# Patient Record
Sex: Female | Born: 1960 | Race: White | Hispanic: No | Marital: Single | Smoking: Never smoker
Health system: Southern US, Community
[De-identification: ages and names within clinical notes are randomized; demographics above are authoritative.]

## PROBLEM LIST (undated history)

## (undated) DIAGNOSIS — K760 Fatty (change of) liver, not elsewhere classified: Secondary | ICD-10-CM

## (undated) DIAGNOSIS — E039 Hypothyroidism, unspecified: Secondary | ICD-10-CM

## (undated) DIAGNOSIS — N289 Disorder of kidney and ureter, unspecified: Secondary | ICD-10-CM

## (undated) HISTORY — DX: Hypothyroidism, unspecified: E03.9

## (undated) HISTORY — DX: Disorder of kidney and ureter, unspecified: N28.9

## (undated) HISTORY — DX: Fatty (change of) liver, not elsewhere classified: K76.0

## (undated) HISTORY — DX: Morbid (severe) obesity due to excess calories: E66.01

---

## 2006-03-27 ENCOUNTER — Ambulatory Visit
Admit: 2006-03-27 | Disposition: A | Discharge status: Home / Self Care | Payer: Self-pay | Source: Ambulatory Visit | Admitting: Internal Medicine

## 2006-06-23 ENCOUNTER — Ambulatory Visit
Admit: 2006-06-23 | Disposition: A | Discharge status: Home / Self Care | Payer: Self-pay | Source: Ambulatory Visit | Admitting: Orthopaedic Surgery

## 2013-10-23 ENCOUNTER — Ambulatory Visit (INDEPENDENT_AMBULATORY_CARE_PROVIDER_SITE_OTHER): Payer: Self-pay | Admitting: Surgery

## 2014-01-01 ENCOUNTER — Ambulatory Visit (INDEPENDENT_AMBULATORY_CARE_PROVIDER_SITE_OTHER): Payer: Self-pay | Admitting: Surgery

## 2014-01-02 ENCOUNTER — Ambulatory Visit (INDEPENDENT_AMBULATORY_CARE_PROVIDER_SITE_OTHER): Payer: Commercial Managed Care - POS | Admitting: Surgery

## 2014-01-02 ENCOUNTER — Ambulatory Visit (INDEPENDENT_AMBULATORY_CARE_PROVIDER_SITE_OTHER): Payer: Commercial Managed Care - POS

## 2014-01-02 ENCOUNTER — Encounter (INDEPENDENT_AMBULATORY_CARE_PROVIDER_SITE_OTHER): Payer: Self-pay | Admitting: Surgery

## 2014-01-02 DIAGNOSIS — Z9884 Bariatric surgery status: Secondary | ICD-10-CM | POA: Insufficient documentation

## 2014-01-02 NOTE — Progress Notes (Signed)
S:  Pt presents for 6 month post sleeve surgery for diet follow up.  Pt has lost 69 pounds at this time and reports being very happy with weight loss.  Pt states consuming about 1/2 cup per meal.  Pt continues with all recommended vitamin/mineral supplements at this time.  Pt also reports she has discontinued protein from a supplemental source.  Pt reports she has tried but just doesn't care for any of the supplements.  Pt reports to help she has used tofu put in with hummus.  Pt reports no issues with hair falling out.   For physical activity, pt reports participating in:  1x/week 1 1/2 miles.  Currently, pt is able to consume about 64 oz clear fluids daily.  Pt reports no issues with N/V/C/D, although per pt report, pt does have nausea with every meal at this time. Pt reports meal pattern eating 3 meals/day, no snacks in between.  Time between meals: 5-6 hours.  Pt reports following 30/30 rule with meals.    O:  Today's Wt: 263 pounds     Previous Visit Wt:    Wt Readings from Last 10 Encounters:   01/02/14 119.296 kg (263 lb)     BMI:  45  Total Wt Loss:  69 pounds  Surgery Date:  07/22/13  Pertinent Lab Values:  See chart, values for CBC wnl for nutrition  Allergies:    Allergies   Allergen Reactions   . Tylenol (Acetaminophen) Hives       Diet Recall:   Breakfast: Easy over Egg, 1/2 6oz yogurt (greek): (14 grams pro)   Lunch: 1/2 grilled chicken (21 gms pro); 1/4 cup mixed fruit   Dinner: 1 boneless skinless chicken thigh (14 grams pro); 2 tbsp mashed potatos    Snacks: 30 pieces peanuts (7grams)   Beverages:water or unsweetened iced-tea.   Alcohol:    A:  Per diet recall, pt consumes appropriate portion sizes daily.  Meal patterns are inappropriate, pt would benefit from adding snack (high protein/produce); This may assist with decreasing nausea at meals; Pt would also benefit from increasing exercise. Food choices are acceptable and appropriate - most are high in lean protein and produce.  Pt would benefit from  measuring portions to assist with meeting needs as per report- pt keeps myfitness pal and protein is lower than target; per report today her protein intake was about 60 grams and therefore adequate.  Pt appears motivated to continue monitoring calories, exercising regularly and following recommended dietary guidelines to continue and then maintain weight loss.    P:  1.  Pt to maintain portion sizes to 3/4c per meal.  2.  Pt to continue using protein rich foods/supplements for snacks at this time.  3.  Pt to f/u in 6 months or PRN.  Pt provided contact information for any questions or concerns.

## 2014-01-02 NOTE — Progress Notes (Signed)
Subjective:       Patient ID: Alexis Coleman is a 53 y.o. female who returns for follow up 6 months post Laparoscopic Sleeve Gastrectomy. She is doing well with 70 lbs. total weight loss. She is tolerating a regular diet and denies any  vomiting, reflux, abdominal pain but has occasional nausea especially with the protein shakes. She is compliant with portion control and vitamin supplementation. Her energy levels are improving. She is exercising regularly.  Lab work is pending.  She has no further liver or kidney issues since her post-op re-admission for jaundice and worsening renal function.      HPI      Review of Systems   Constitutional: Negative for fever, chills and fatigue.   Respiratory: Negative for shortness of breath.    Gastrointestinal: Positive for nausea (mild with overeating and with protein shakes). Negative for vomiting, abdominal pain, diarrhea, constipation and abdominal distention.   Skin: Positive for rash (in the pannus requiring Nystatin powder). Negative for color change.           Objective:    Physical Exam   Nursing note and vitals reviewed.  Constitutional: She appears well-developed and well-nourished.   HENT:   Head: Atraumatic.   Eyes: EOM are normal. No scleral icterus.   Pulmonary/Chest: Breath sounds normal.   Abdominal:        Soft, NT, ND, incisions well healed, no hernias   Skin: Skin is warm and dry.           Assessment:     s/p Lap Sleeve Gastrectomy      Plan:     Overall good results thus far.  We talked about increasing protein via diet if she is unable to consume the protein shakes.  We talked about adding protein to her foods and soups, etc.  Once her labs come back I will call her.  With look carefully at her LFT's and creatine along with the vitamin  Levels.  F/u 3 months    Cc: Dr. Loistine Chance

## 2014-01-02 NOTE — Patient Instructions (Signed)
Provided pt with Meal Pattern handout.  Provided pt with hard copy of instructions reviewed.  Pt verbalized understanding and desired compliance.  No questions or concerns voiced.

## 2014-01-06 ENCOUNTER — Encounter (INDEPENDENT_AMBULATORY_CARE_PROVIDER_SITE_OTHER): Payer: Self-pay | Admitting: Surgery

## 2014-01-06 ENCOUNTER — Encounter (INDEPENDENT_AMBULATORY_CARE_PROVIDER_SITE_OTHER): Payer: Self-pay

## 2014-04-01 ENCOUNTER — Ambulatory Visit (INDEPENDENT_AMBULATORY_CARE_PROVIDER_SITE_OTHER): Payer: Commercial Managed Care - POS | Admitting: Surgery

## 2014-04-09 ENCOUNTER — Encounter (INDEPENDENT_AMBULATORY_CARE_PROVIDER_SITE_OTHER): Payer: Self-pay | Admitting: Surgery

## 2014-04-09 ENCOUNTER — Encounter (INDEPENDENT_AMBULATORY_CARE_PROVIDER_SITE_OTHER): Payer: Self-pay

## 2014-04-09 ENCOUNTER — Ambulatory Visit (INDEPENDENT_AMBULATORY_CARE_PROVIDER_SITE_OTHER): Payer: Commercial Managed Care - POS | Admitting: Surgery

## 2014-04-09 ENCOUNTER — Ambulatory Visit (INDEPENDENT_AMBULATORY_CARE_PROVIDER_SITE_OTHER): Payer: Commercial Managed Care - POS

## 2014-04-09 DIAGNOSIS — Z9884 Bariatric surgery status: Secondary | ICD-10-CM

## 2014-04-09 MED ORDER — VITAMIN D3 250 MCG (10000 UT) PO CAPS
50000.0000 [IU] | ORAL_CAPSULE | ORAL | Status: AC
Start: 2014-04-09 — End: ?

## 2014-04-09 MED ORDER — VITAMIN D3 250 MCG (10000 UT) PO CAPS
5000.0000 [IU] | ORAL_CAPSULE | Freq: Every day | ORAL | Status: AC
Start: 2014-04-09 — End: ?

## 2014-04-09 NOTE — Progress Notes (Signed)
S:  Pt presents for 6 month post SG diet follow up.  Pt has lost 87 pounds at this time and reports being very happy with weight loss.  Pt states consuming about 1/2-3/4 c per meal. Pt reports meal pattern: 3 meals/ 1 snack (a.m.).  Pt reports she is measuring all portions and using self-monitoring (myfitnesspal) to help with accountability.  Pt is also actively attending support groups ([place]).   Pt continues with all recommended vitamin/mineral supplements at this time.  Pt reports she is still struggling with protein supplements and has not been consistently getting in a supplemental protein source.  For physical activity, pt reports participating in: walking 3x/week for 3 miles.  Pt reports she has recently increased to 4 miles.   Pt reports no issues with N/V/C/D at this time.    O:  Today's Wt:  See below   Previous Visit Wt:    Wt Readings from Last 10 Encounters:   04/09/14 246 lb   01/02/14 263 lb     BMI:  There is no weight on file to calculate BMI.  Total Wt Loss:  See above-87 pounds  Surgery Date:  09/14  Pertinent Lab Values:  Received orders; will follow up @ or before 9 month visit.  Allergies:    Allergies   Allergen Reactions   . Tylenol [Acetaminophen] Other (See Comments)     Liver failure--hepatitis       Diet Recall:   Breakfast: 2 eggs (14 grams protein)   Snack: 1 yogurt (12 grams protein)   Lunch: 2oz protein   Dinner: 2 oz protein   Snacks:   Beverages: water   Alcohol:    A:  Per diet recall, pt consumes appropriate portion sizes daily.  Food choices are acceptable and appropriate - most are high in lean protein.  Per report, pt would benefit from adding produce and additional protein snack to assist with meeting needs.  Current dietary intake providing 42-54 grams/daily, adding additional yogurt will provide 66 grams which would assist with meeting recommended needs.  Pt open to additional high protein snack.  Pt appears motivated to continue monitoring calories, exercising regularly  and following recommended dietary guidelines to continue and then maintain weight loss.    P:  1.  Pt to maintain portion sizes to 3/4 c- 1 cup per meal.  2.  Pt to continue using protein rich foods/supplements for snacks adding additional yogurt as a snack/day. (meal pattern: 3 meals +2 snacks).  3.  Pt to f/u in 3 months or PRN.  Pt provided contact information for any questions or concerns.

## 2014-04-09 NOTE — Patient Instructions (Signed)
Provided pt with Support Group for [employer].and Reviewed and discussed portion sizes.   Pt verbalized understanding and desired compliance.  All questions concerns answered.

## 2014-04-09 NOTE — Progress Notes (Signed)
6 month follow up exercise session complete.  Pt said she is walking 3 times per week up to 3-4 miles.  Encouraged her to continue, however to add 15 minutes of strength training.  Also encouraged her to sprinkle strength exercises throughout her day while she is at work.  Provided pt with a comprehensive list of exercises, as well as demonstrations of each. Pt successfully demonstrated exercises as well.  Pt seemed very receptive.

## 2014-04-09 NOTE — Progress Notes (Signed)
Drs. Fermin Schwab, [place], and [place]   [address]   [place], [place] [address]   [phone]   [phone]     [address], [address]   [place], [place] [address]   [phone]   [phone]  Dear Dr. Loistine Chance,    Ms. Alexis Coleman returns for follow up 6 months post laparoscopic sleeve gastrectomy.  She is doing well with a 85 lbs. total weight loss.  She is tolerating a regular diet and denies any nausea, vomiting, reflux or abdominal pain.  She is compliant with portion control and vitamin supplementation.  Her energy level is good.  She is exercising regularly.  Lab work was reviewed.    CURRENT PROBLEM LIST:   Patient Active Problem List   Diagnosis   . Morbid obesity   . Bariatric surgery status     PAST MEDICAL HISTORY:   Past Medical History   Diagnosis Date   . Fatty liver disease, nonalcoholic    . Kidney disease      Polycystic kidney   . Morbid obesity    . Hypothyroidism      PAST SURGICAL HISTORY:   Past Surgical History   Procedure Laterality Date   . Colonoscopy     . Wisdom tooth extraction     . Gastrectomy       9/14     CURRENT OUTPATIENT MEDICATIONS:   Outpatient Prescriptions Marked as Taking for the 04/09/14 encounter (Office Visit) with Champ Mungo, MD   Medication Sig Dispense Refill   . calcium carbonate (OS-CAL) 1250 MG tablet Take 1 tablet by mouth daily.       Marland Kitchen levothyroxine (SYNTHROID, LEVOTHROID) 25 MCG tablet Take 25 mcg by mouth Once a day at 6:00am.       . Multiple Vitamins-Minerals (ANTIOXIDANT FORMULA SG) capsule Take 1 capsule by mouth daily.       Marland Kitchen nystatin (MYCOSTATIN) powder Apply topically 2 (two) times daily.       . vitamin B-12 (CYANOCOBALAMIN) 1000 MCG tablet Take 1,000 mcg by mouth daily.         ALLERGIES:   Allergies   Allergen Reactions   . Tylenol [Acetaminophen] Other (See Comments)     Liver failure--hepatitis      History   Substance Use Topics   . Smoking status: Never Smoker    . Smokeless tobacco: Not on file   . Alcohol  Use: No      Family History   Problem Relation Age of Onset   . Diabetes Mother    . Hypertension Mother    . Heart disease Father         Physical Exam:W/ female chaperone present during entire examination:  BP 120/78   Pulse 55   Temp(Src) 98 F (36.7 C)   Ht 5\' 4"    Wt 246 lb   BMI 42.21 kg/m2       General appearance: alert, appears stated age and cooperative  Head: Normocephalic, without obvious abnormality, atraumatic  Eyes: negative findings: conjunctivae and sclerae normal  Lungs: clear to auscultation bilaterally  Heart: S1, S2 normal  Abdomen: soft, non-tender; bowel sounds normal; no masses,  no organomegaly      Assessment:  Morbid obesity  Bariatric Status    Plan:  S/p Laparoscopic sleeve gastrectomy--Ms. Yeomans is progressing well from surgery.  We talked in length about portion control (measuring meals consistently), food choices, consistency of meals, protein and vitamin supplementation.  I reviewed their labs today.  Her vitamin D level is slightly low.  I prescribed her replacement vitamin D.  Her creatinine and liver function tests are all within normal limits.  Per patient, her nephrologist and gastroenterologist are satisfied with her results thus far.  We will continue to follow her kidney and liver function with routine blood work.    If you have any questions or concerns, please do not hesitate to contact me.      Thank you for allowing me to participate in their care.      Tommye Standard, MD, FACS

## 2014-04-11 ENCOUNTER — Encounter (INDEPENDENT_AMBULATORY_CARE_PROVIDER_SITE_OTHER): Payer: Self-pay | Admitting: Surgery

## 2014-07-01 ENCOUNTER — Ambulatory Visit (INDEPENDENT_AMBULATORY_CARE_PROVIDER_SITE_OTHER): Payer: Commercial Managed Care - POS

## 2014-07-08 ENCOUNTER — Ambulatory Visit (INDEPENDENT_AMBULATORY_CARE_PROVIDER_SITE_OTHER): Payer: Commercial Managed Care - POS | Admitting: Surgery

## 2014-07-08 ENCOUNTER — Ambulatory Visit (INDEPENDENT_AMBULATORY_CARE_PROVIDER_SITE_OTHER): Payer: Commercial Managed Care - POS

## 2014-07-08 ENCOUNTER — Encounter (INDEPENDENT_AMBULATORY_CARE_PROVIDER_SITE_OTHER): Payer: Self-pay | Admitting: Surgery

## 2014-07-08 ENCOUNTER — Encounter (INDEPENDENT_AMBULATORY_CARE_PROVIDER_SITE_OTHER): Payer: Self-pay

## 2014-07-08 VITALS — BP 126/75 | HR 47 | Temp 98.4°F | Ht 64.0 in | Wt 244.0 lb

## 2014-07-08 DIAGNOSIS — Z9884 Bariatric surgery status: Secondary | ICD-10-CM

## 2014-07-08 DIAGNOSIS — Z6841 Body Mass Index (BMI) 40.0 and over, adult: Secondary | ICD-10-CM

## 2014-07-08 NOTE — Patient Instructions (Signed)
Provided pt with Vitamin/mineral  handout.  Provided pt with hard copy of instructions reviewed.  Pt verbalized understanding and desired compliance. All questions concerns/answered.

## 2014-07-08 NOTE — Progress Notes (Signed)
Drs. Fermin Schwab, [place], and [place]   [address]   [place], [place] [address]   [phone]   [phone]     [address]   [place], [place] [address]   [phone]   [phone]  Dear Dr. Loistine Chance,    Alexis Coleman  returns for follow up 11 months post Sleeve Gastrectomy. She is doing well with 90 lbs. total weight loss. She is tolerating a regular diet and denies any nausea (minimal), vomiting, reflux or abdominal pain. She is compliant with portion control and vitamin supplementation. Her energy level is good. She is to begin exercising regularly now her boot is off.  Lab work was just recently performed--we are awaiting results.  No jaundice.    CURRENT PROBLEM LIST:   Patient Active Problem List   Diagnosis   . Morbid obesity   . Bariatric surgery status     PAST MEDICAL HISTORY:   Past Medical History   Diagnosis Date   . Fatty liver disease, nonalcoholic    . Kidney disease      Polycystic kidney   . Morbid obesity    . Hypothyroidism      PAST SURGICAL HISTORY:   Past Surgical History   Procedure Laterality Date   . Colonoscopy     . Wisdom tooth extraction     . Gastrectomy       9/14     CURRENT OUTPATIENT MEDICATIONS:   Outpatient Prescriptions Marked as Taking for the 07/08/14 encounter (Office Visit) with Champ Mungo, MD   Medication Sig Dispense Refill   . calcium carbonate (OS-CAL) 1250 MG tablet Take 1 tablet by mouth daily.     . Cholecalciferol (VITAMIN D3) 10000 UNITS capsule Take 5,000 Units by mouth daily. 30 capsule 3   . levothyroxine (SYNTHROID, LEVOTHROID) 25 MCG tablet Take 25 mcg by mouth Once a day at 6:00am.     . Multiple Vitamins-Minerals (ANTIOXIDANT FORMULA SG) capsule Take 1 capsule by mouth daily.     Marland Kitchen nystatin (MYCOSTATIN) powder Apply topically 2 (two) times daily.     . vitamin B-12 (CYANOCOBALAMIN) 1000 MCG tablet Take 1,000 mcg by mouth daily.       ALLERGIES:   Allergies   Allergen Reactions   . Tylenol [Acetaminophen] Other  (See Comments)     Liver failure--hepatitis      History   Substance Use Topics   . Smoking status: Never Smoker    . Smokeless tobacco: Not on file   . Alcohol Use: No      Family History   Problem Relation Age of Onset   . Diabetes Mother    . Hypertension Mother    . Heart disease Father         Physical Exam:W/ female chaperone present during entire examination:  BP 126/75 mmHg  Pulse 47  Temp(Src) 98.4 F (36.9 C)  Ht 5\' 4"   Wt 244 lb  BMI 41.86 kg/m2    General appearance: alert, appears stated age and cooperative  Head: Normocephalic, without obvious abnormality, atraumatic  Eyes: negative findings: conjunctivae and sclerae normal  Lungs: clear to auscultation bilaterally  Heart: S1, S2 normal  Abdomen: soft, non-tender; bowel sounds normal; no masses,  no organomegaly, no hernias      Assessment:  Morbid obesity  Bariatric Status    Plan:  S/p Laparoscopicsleeve gastrectomy --Alexis Coleman is progressing well from surgery.  We talked in length about portion control (  measuring meals consistently), food choices, consistency of meals, protein and vitamin supplementation.  Follow-up 6 months.  We are currently awaiting her lab results.  If there are any issues  I will contact her.    If you have any questions or concerns, please do not hesitate to contact me.      Thank you for allowing me to participate in their care.      Tommye Standard, MD, FACS

## 2014-07-08 NOTE — Progress Notes (Signed)
S:  Pt presents for 9 month post sleeve diet follow up.  Pt has lost 2 pounds at this time and reports being a little disappointed with weight loss-however- had a bone spur required boot for 4 weeks which may have contributed to slower rate of loss.  Pt states consuming about 1 cup  per meal.  Pt is measuring portions and reports meal pace 30-45 minutes.  Following 30/30 rule.   Pt continues with all recommended vitamin/mineral supplements at this time ([employer] multi; calcium 1,000mg  qd; vit d (2,000IU); no b-12; .  Pt also reports consuming a little  protein daily from a supplemental source.  For physical activity, pt reports participating in:  2 mile walks.  Currently, pt is able to consume about 64 oz clear fluids daily.  Pt reports no issues with N/V/C/D at this time, except if tries extra bite, will experience nausea.    O:  Today's Wt:  244 lb    Previous Visit Wt:    Wt Readings from Last 10 Encounters:   07/08/14 244 lb   07/08/14 244 lb   04/09/14 246 lb   01/02/14 263 lb     BMI:  Body mass index is 41.86 kg/(m^2).  Total Wt Loss:  19 pounds  Surgery Date:  07/22/14   Pertinent Lab Values:  Completed 07/07/14; results pending; 12 month lab orders provided today  Allergies:    Allergies   Allergen Reactions   . Tylenol [Acetaminophen] Other (See Comments)     Liver failure--hepatitis       Diet Recall:   Breakfast: 2 eggs/2 slices bacon (14 grams protein)   Lunch: Chicken Parmesan (21 grams pro)- squash and zucchini; 1/4 cottage cheese (5 grams pro)   Snack: [place] Yogurt w/fruit (15 grams pro; 400 mg Ca)   Dinner: Hamburger (21 grams protein)    Snacks:   Beverages: water   Alcohol:    A:  Per report, pt continues to be motivated to following through with nutrition/exercise recommendations.  Per diet recall, pt consumes appropriate portion sizes daily.  Food choices are acceptable and appropriate - most are high in lean protein and produce.  Pt meeting recommended fluid needs (64 oz/day) and meeting protein  needs without need for additional supplement (~ 71 gms pro from food).  Pt would benefit from checking Vit D and multivitamin to ensure meeting 200% RDAS- provide with list of acceptable OTC vitamins/minerals.  Reviewed need to take B-12.    Pt appears motivated to continue monitoring calories, exercising regularly and following recommended dietary guidelines to continue and then maintain weight loss.    P:  1.  Pt to maintain portion sizes to 1cup per meal.  2.  Pt to continue using protein rich foods/supplements for snacks at this time.  3.  Pt to f/u in 3 months or PRN.  Pt provided contact information for any questions or concerns.    Spent a total of 30 minutes educating pt in a individual one-on-one setting.

## 2014-08-06 ENCOUNTER — Ambulatory Visit (INDEPENDENT_AMBULATORY_CARE_PROVIDER_SITE_OTHER): Payer: Commercial Managed Care - POS

## 2014-08-06 ENCOUNTER — Encounter (INDEPENDENT_AMBULATORY_CARE_PROVIDER_SITE_OTHER): Payer: Self-pay

## 2014-08-06 DIAGNOSIS — Z6841 Body Mass Index (BMI) 40.0 and over, adult: Secondary | ICD-10-CM

## 2014-08-06 DIAGNOSIS — Z9884 Bariatric surgery status: Secondary | ICD-10-CM

## 2014-08-06 NOTE — Patient Instructions (Signed)
Provided pt with 1200 calorie meal plan handout.  Provided pt with hard copy of instructions reviewed.  Pt verbalized understanding and desired compliance.  All  questions or concerns questions.

## 2014-08-06 NOTE — Progress Notes (Signed)
8month exercise follow up complete.  Discussed a recent foot injury that was bothering pt. She expressed feeling discouraged and that it was a "set back." Created an exercise plan b and c that she can do, despite her injury and how to not allow one thing to keep her from doing anything.  Provided a calendar plan for patient.

## 2014-08-06 NOTE — Progress Notes (Signed)
S:  Pt presents for 12 month post sleeve diet follow up.  Pt reports she has not been able to be as active and exercise as she would like, however, reports she completed her first 5k Sunday (walked), however injured her foot (heel spur).  Pt has maintained weight at 244 pounds at this time and reports she is trying not to get frustrated but overall weight loss is encouraging.   Pt states consuming about 8-10 ounces total per meal.  Pt continues with all recommended vitamin/mineral supplements at this time.  Pt also reports consuming 45 grams protein daily from a supplemental source (premier protein and bene protein).  For physical activity, pt reports participating in: not routinely.  Currently, pt is able to consume about 60-70oz clear fluids daily.  Pt reports no issues with N/V/C/D at this time. Pt is taking the following multivitamin mineral supplements: [employer] multi 2 tablets qd (contains iron),  vit b-12 qd, calcium chews 2 chews (1,000 IU's day) - Vit D3 supplement (5,000IU qd), no B-50 complex at this time.  O:  Today's Wt:  244 lb    Previous Visit Wt:    Wt Readings from Last 10 Encounters:   08/06/14 244 lb   07/08/14 244 lb   07/08/14 244 lb   04/09/14 246 lb   01/02/14 263 lb     BMI:  Body mass index is 41.86 kg/(m^2).  Total Wt Loss:  88 pounds  Surgery Date:  07/22/13 (pre-op weight 332 lbs).  Pertinent Lab Values:  03/04/14 labs:  Vit D 22.1 (L)  B-12 279 (on lower end of wnl)  Allergies:    Allergies   Allergen Reactions   . Tylenol [Acetaminophen] Other (See Comments)     Liver failure--hepatitis       Diet Recall:   Breakfast: 2 eggs and 2 bacon 1/4 cup watermelon (14 grams pro)   Snack: 1/4 watermelon   Snack: Premier Protein Shake (30 grams pro)   Lunch: 1/2 multigrain sandwich -3 oz (21 grams pro)   Snack: [employer] Yogurt with strawberries (12 grams protein)   Dinner: 3/4 of Cheeseburger (3oz)   With 1 slice [employer] (not low fat). (17 grams protein)   Snacks:   Beverages: 60  ounces water   Alcohol:    A: Per report, pt is measuring her portions and taking supplements as recommended. Per diet recall, pt consumes appropriate portion sizes daily.  Food choices are acceptable and appropriate - most are high in lean protein. Pt may benefit from adding more produce.  Fluid intake adequate as evidenced by report (no issues with N/C) and per report, pt should be getting adequate Vit D to repleat stores- may need to repeat test and may benefit from taking Vit D following meal (essential fat from meal may assist with absorption). Protein intake 82 grams pro / day (52 grams from food)- which is adequate to meet needs.  Pt appears motivated to continue monitoring calories, exercising regularly and following recommended dietary guidelines to continue and then maintain weight loss.    P:  1.  Pt to maintain portion sizes to 1 cup per meal.  2.  Pt to continue using protein rich foods/supplements for snacks at this time.  3.  Pt to f/u in 3 months or PRN.  Pt provided contact information for any questions or concerns.    Spent a total of 30 minutes educating pt in a individual one-on-one setting.

## 2015-01-06 ENCOUNTER — Ambulatory Visit (INDEPENDENT_AMBULATORY_CARE_PROVIDER_SITE_OTHER): Payer: Commercial Managed Care - POS | Admitting: Surgery

## 2020-01-28 IMAGING — MR MRI ABDOMEN WITHOUT CONTRAST
8 of 11 series · 30 of 48 positions shown · non-contrast
Comparison: None.

MRI ABDOMEN WITHOUT CONTRAST, 01/28/2020 [DATE]: 
CLINICAL INDICATION:  Follow-up cryoablation LEFT renal malignancy. Cryoablation 
procedure performed July 2019.
TECHNIQUE: Multiplanar, multiecho position MR images of the abdomen were 
performed without intravenous contrast enhancement. This exam is performed as 
ordered without intravenous contrast enhancement.

[Series 201: survey navi · axial · 15.0mm · 1.76mm/px · 1 of 11 slices shown]
[im 1/11]
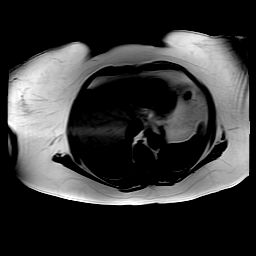

[Series 301: (id)* · coronal · 5.5mm · 0.78mm/px · 1 of 32 slices shown]
[im 1/32]
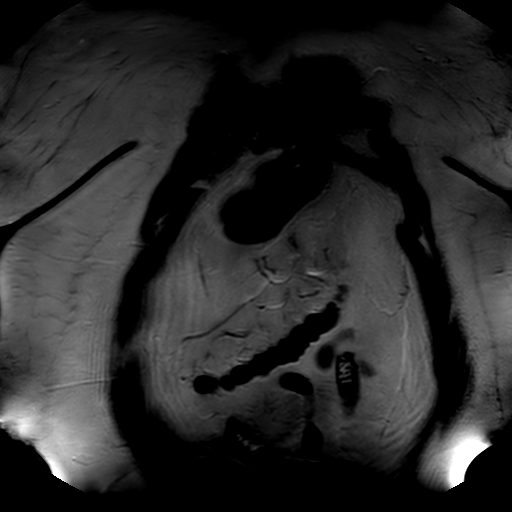

[Series 401: dwi_3b_nav · axial · 5.5mm · 1.56mm/px · z∈[-80,+184]mm · 6 of 90 slices shown]
[im 1/90]
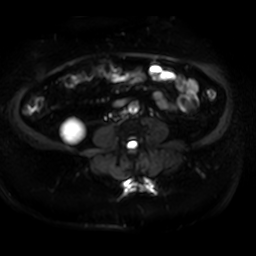
[im 18/90]
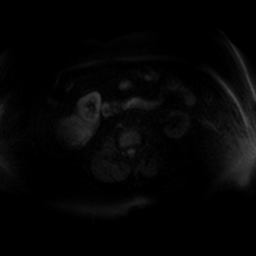
[im 36/90]
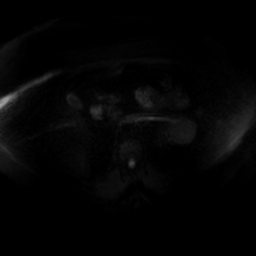
[im 54/90]
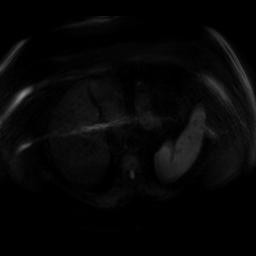
[im 72/90]
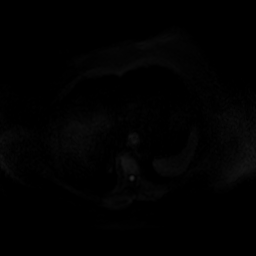
[im 90/90]
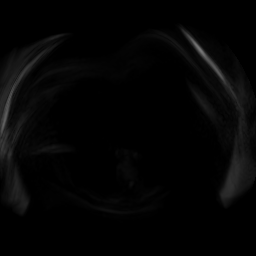

[Series 402: dadc 600 · axial · 5.5mm · 1.56mm/px · z∈[-80,+184]mm · 3 of 45 slices shown]
[im 1/45]
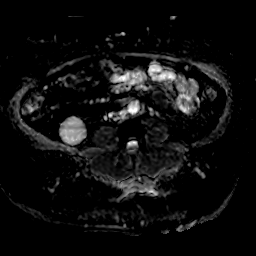
[im 23/45]
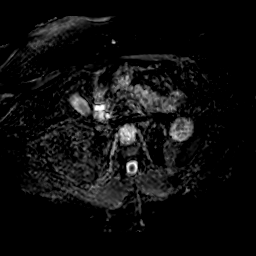
[im 45/45]
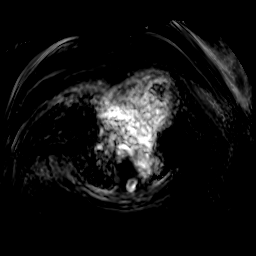

[Series 403: sb0 · axial · 5.5mm · 1.56mm/px · z∈[-80,+184]mm · 3 of 45 slices shown]
[im 1/45]
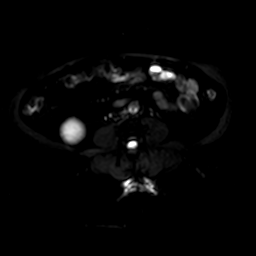
[im 23/45]
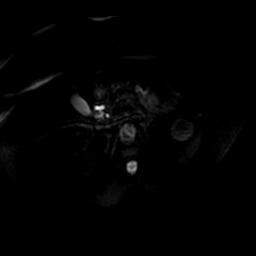
[im 45/45]
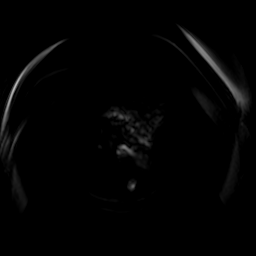

[Series 404: (id) · axial · 5.5mm · 1.56mm/px · z∈[-80,+184]mm · 3 of 45 slices shown]
[im 1/45]
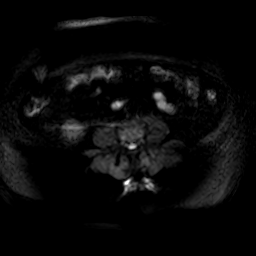
[im 23/45]
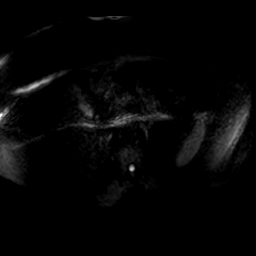
[im 45/45]
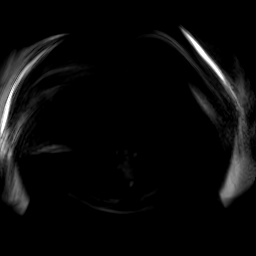

[Series 503: smdixon-in phase · axial · 3.5mm · 0.94mm/px · z∈[-78,+182]mm · 8 of 150 slices shown]
[im 1/150]
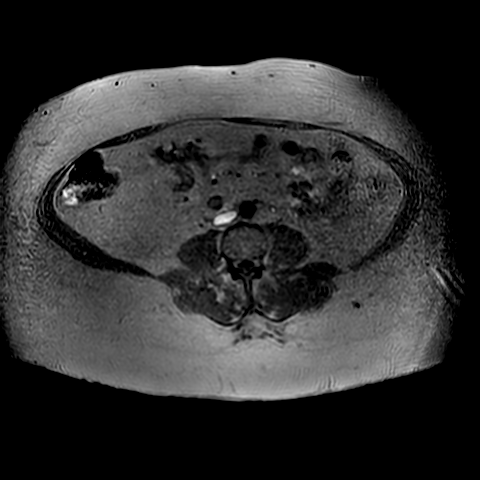
[im 30/150]
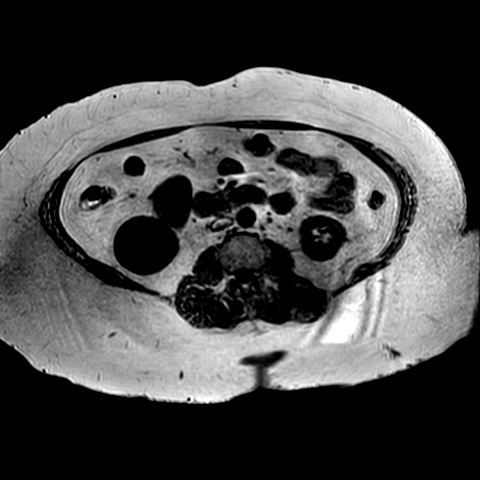
[im 45/150]
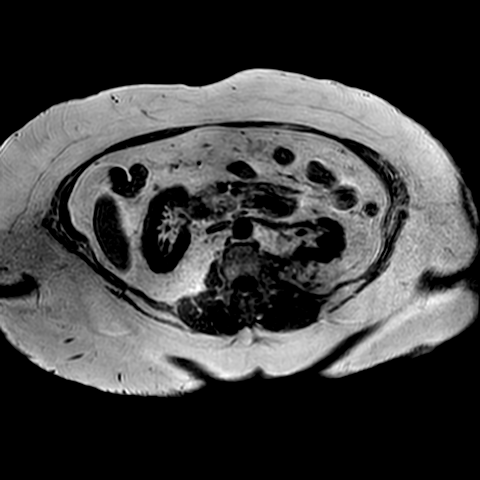
[im 60/150]
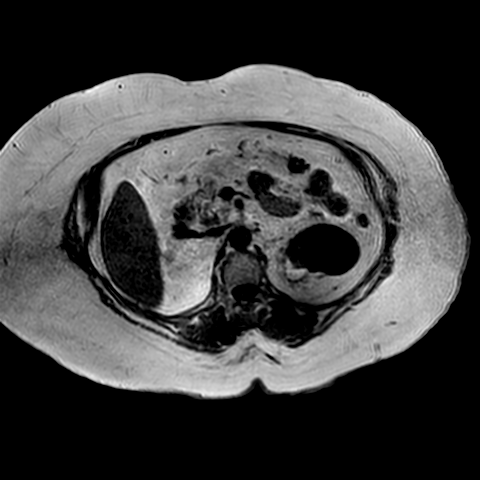
[im 90/150]
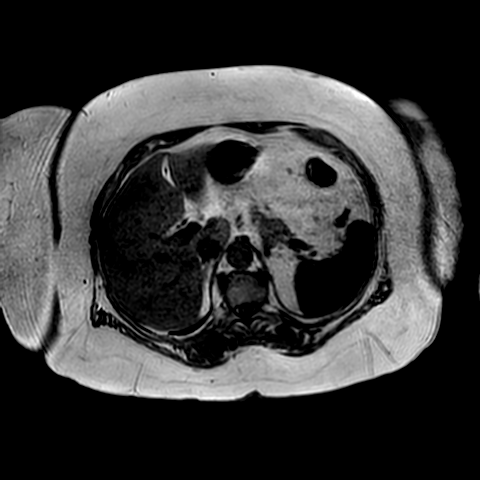
[im 105/150]
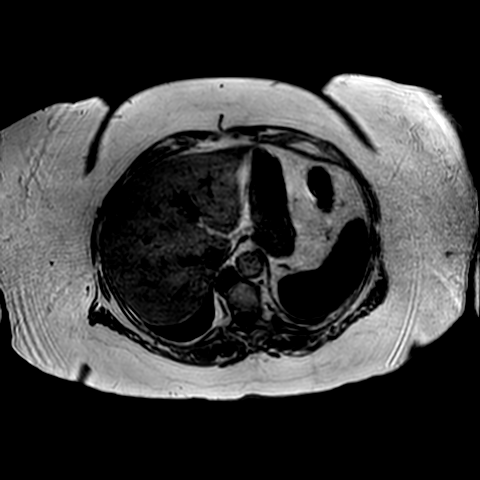
[im 120/150]
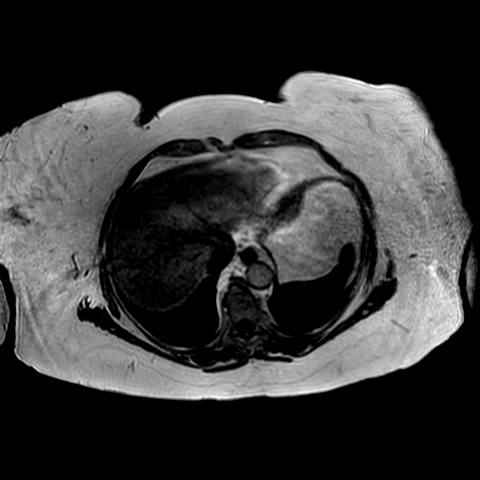
[im 150/150]
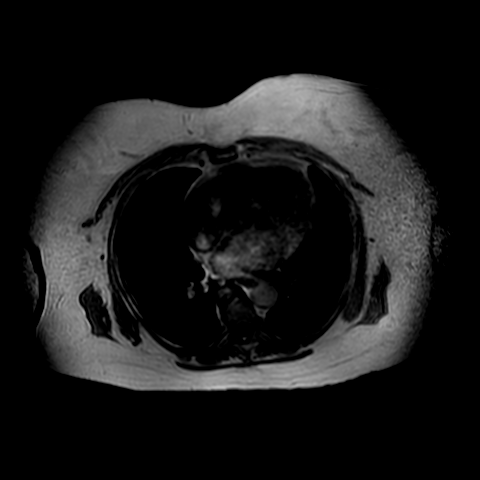

[Series 504: smdixon-out of phase · axial · 3.5mm · 0.94mm/px · z∈[-78,+77]mm · 5 of 150 slices shown]
[im 1/150]
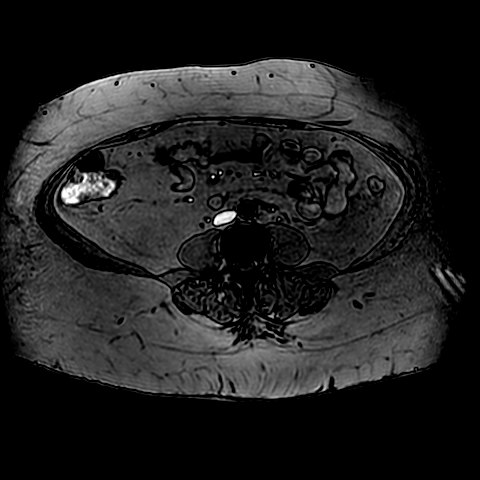
[im 30/150]
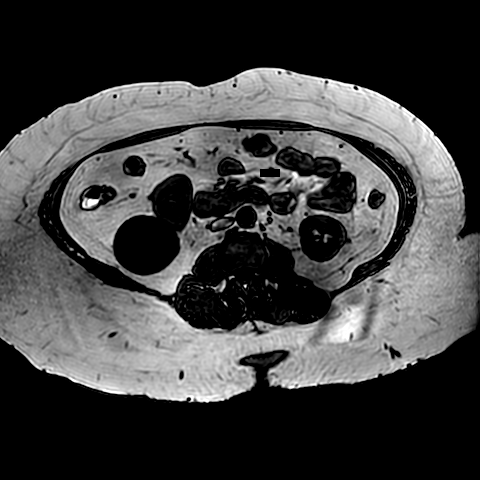
[im 45/150]
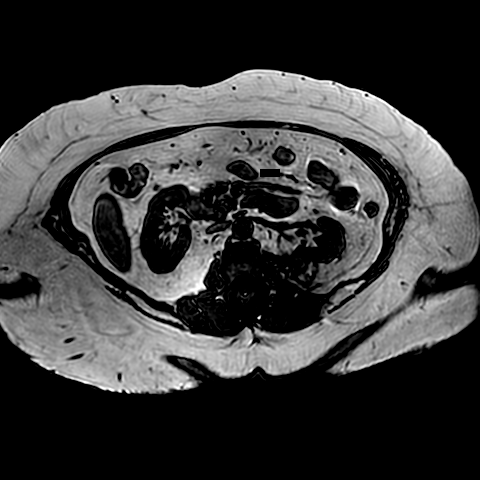
[im 60/150]
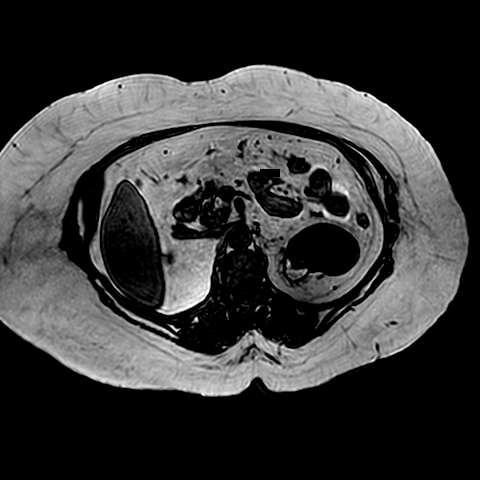
[im 90/150]
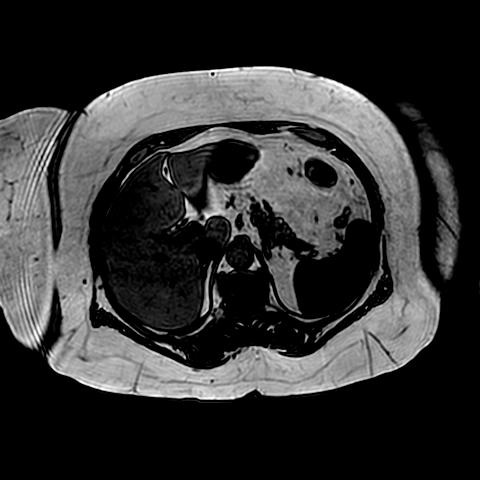

[30 of 48 positions shown; findings below may reference images not displayed]

FINDINGS: Incidental note is made of a 21 mm opacity in the medial base of the RIGHT lower 
lobe. This could represent focal atelectasis, scar, focal pneumonia or even 
neoplasm. In order to exclude neoplasm I recommend that prior exams be obtained 
first including a CT of the abdomen pelvis or chest to determine if this is a 
chronic stable phenomenon which requires no further evaluation or if it is 
potentially a new phenomenon requiring workup. If no prior exams are available 
for whatever reason, enhanced CT imaging of the chest is recommended for further 
evaluation. 
The RIGHT kidney measures approximately 10.6 cm in maximum length. Cortical 
thickness is normal. There is a 6.5 cm Bosniak one cyst projecting dorsally from 
the midpole cortex. The kidney itself is mildly rotated. The RIGHT kidney is 
otherwise unremarkable. 
Left kidney measures 9.5 cm in maximum length. A 5 cm Bosniak one cyst projects 
laterally from the upper pole cortex. An adjacent 13 mm Bosniak one cyst is also 
present. A 3.7 cm predominantly T2 hypointense mass projects exophytically along 
the posteromedial aspect of the upper pole cortex of the LEFT kidney. The 
general appearance of this lesion is consistent with typical appearance of a 
post cryoablation procedure however to exclude recurrent malignancy it would be 
necessary to administer intravenous enhancement. If there is some reason why the 
patient cannot have intravenous enhancement serial follow-up examinations will 
need to be performed to detect any change in the appearance at this location. 
Left kidney is otherwise unremarkable. Both renal veins appear normal in size. 
No retroperitoneal lymphadenopathy is found. No significant hepatic pathology is 
evident. The pancreas and spleen are within normal limits. Both adrenal glands 
appear normal. Incidentally imaged portions of the GI tract are unremarkable. 
The gallbladder and biliary tract are within normal limits. The abdominal aorta 
is normal in caliber.
IMPRESSION: 1. There is an incidental 21 mm irregularly marginated opacity in the medial 
base of the RIGHT lower lobe the lung. Differential diagnosis includes scar, 
atelectasis, pneumonia or neoplasm. The most efficient way of determining 
etiology is to obtain prior comparison CT exams of the chest or abdomen pelvis. 
If this area appears typically benign on prior CT examination and is unchanged 
in size then no further follow-up will be needed. Otherwise further evaluation 
should be performed starting with an intravenously enhanced CT of the chest. 
2. A 3.7 cm predominantly T2 hypointense mass projects posteriorly and medially 
from the upper pole of the LEFT kidney. The appearance is consistent with 
sequela of prior cryoablation however it is not possible to exclude recurrent 
neoplasm without intravenous enhancement. If there is some reason why this 
patient cannot have intravenous enhancement then I would recommend frequent 
follow-up exams to determine stability versus change of this area.

## 2022-07-14 IMAGING — CT CT ABDOMEN AND PELVIS WITHOUT CONTRAST
2 of 3 series · 16 of 46 positions shown, 18 images · non-contrast
Comparison: There are no prior exam(s) available for comparison within the past 
12 months; however, comparison was made to the prior exam(s) 01/28/2020 MRI 
abdomen.

________________________________________________________________________________________________ 
CT ABDOMEN AND PELVIS WITHOUT CONTRAST, 07/14/2022 [DATE]: 
CLINICAL INDICATION: Malignant neoplasm of left kidney, except renal pelvis. 
Status post cryoablation in July 2019. 
A search for DICOM formatted images was conducted for prior CT imaging studies 
completed at a non-affiliated media free facility.
TECHNIQUE: The abdomen and pelvis were scanned from lung bases through the 
pubic rami without contrast on a high-resolution CT scanner using dose reduction 
techniques. Routine MPR reconstructions were performed.

[Series 2: abd/pel ax wo · axial · 0.98mm/px · z∈[-506,-62]mm · 13 of 172 slices shown, 15 images]
[im 12/172  soft-tissue]
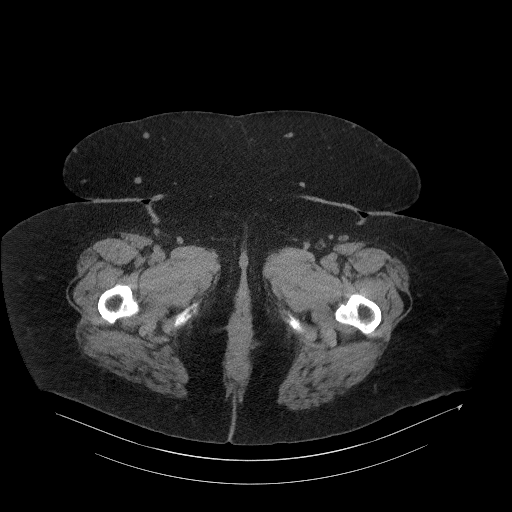
[im 12/172  bone]
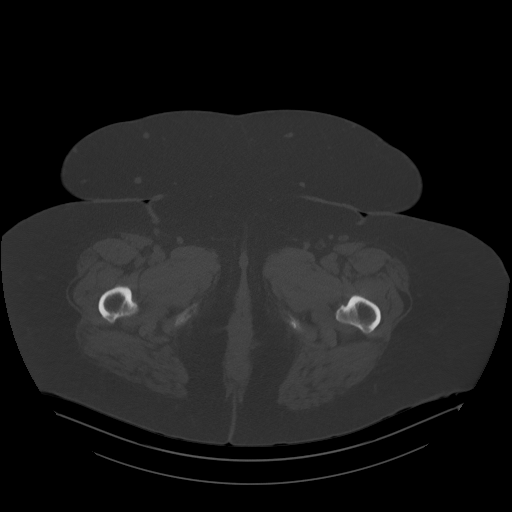
[im 23/172  soft-tissue]
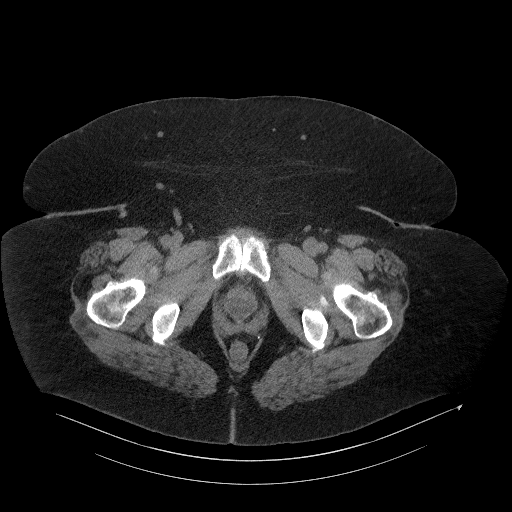
[im 34/172  soft-tissue]
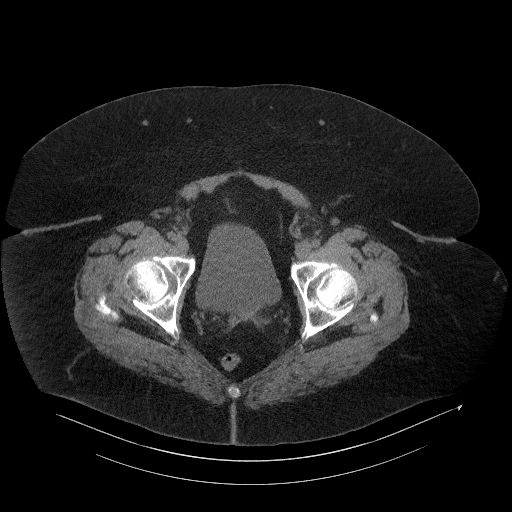
[im 50/172  soft-tissue]
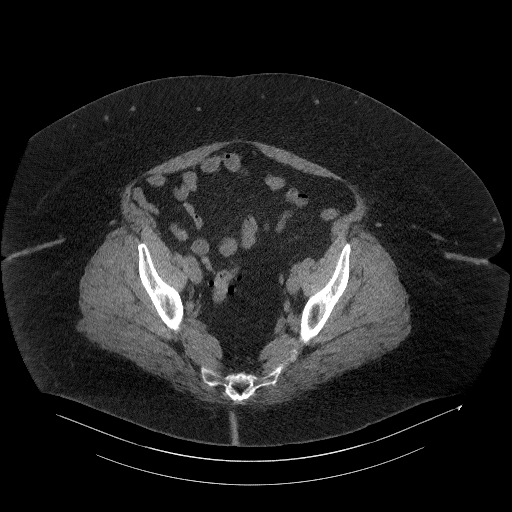
[im 61/172  soft-tissue]
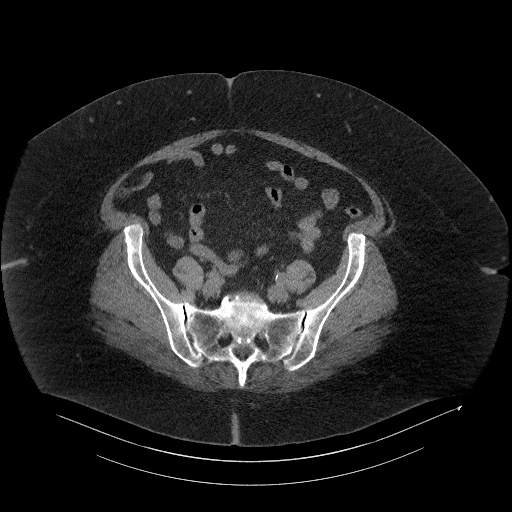
[im 72/172  soft-tissue]
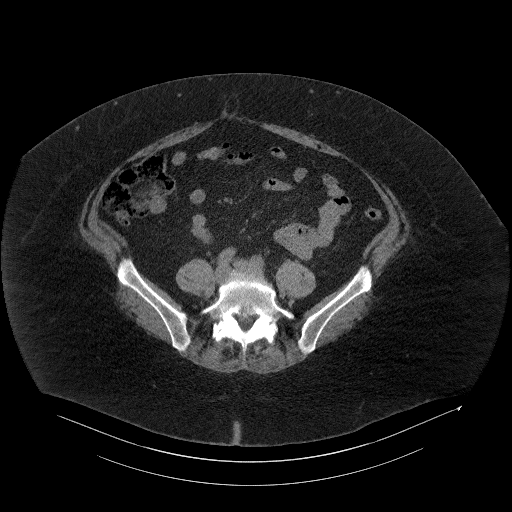
[im 89/172  soft-tissue]
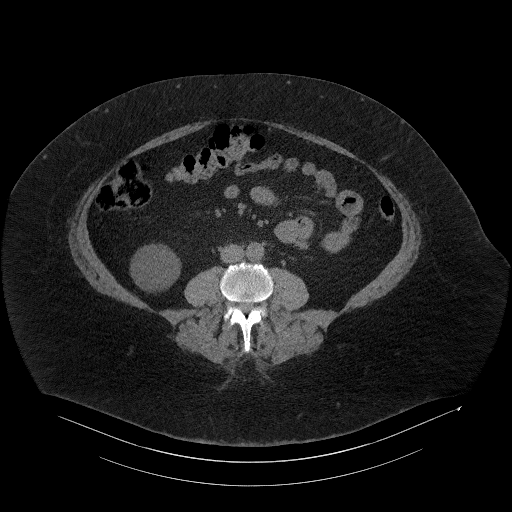
[im 100/172  soft-tissue]
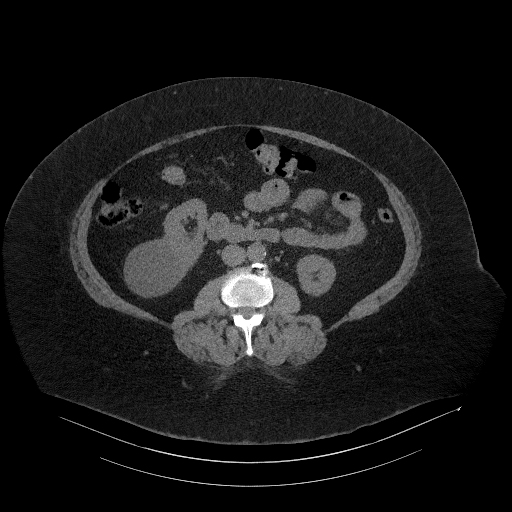
[im 111/172  soft-tissue]
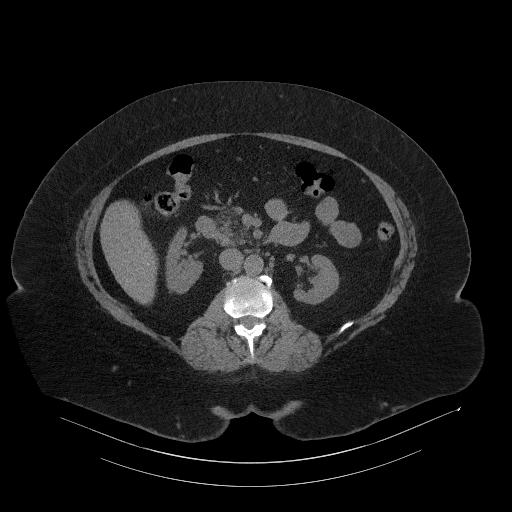
[im 111/172  bone]
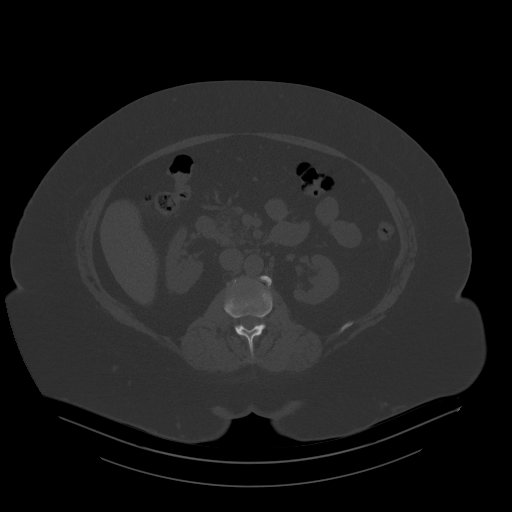
[im 122/172  soft-tissue]
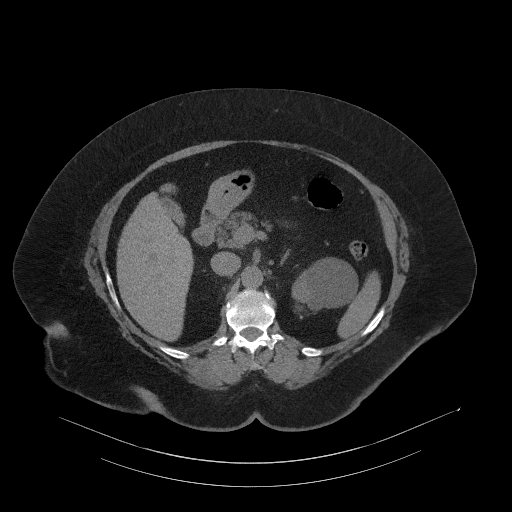
[im 138/172  soft-tissue]
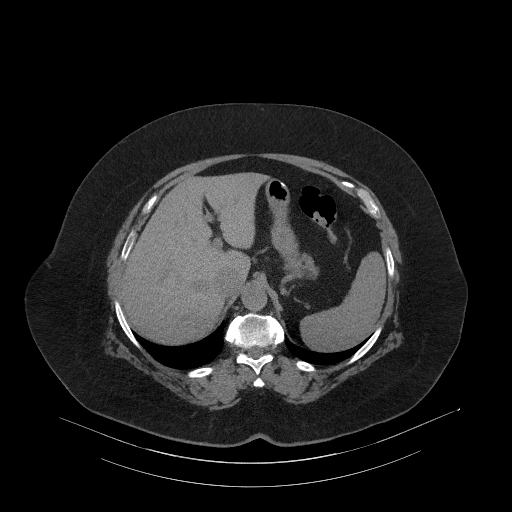
[im 149/172  soft-tissue]
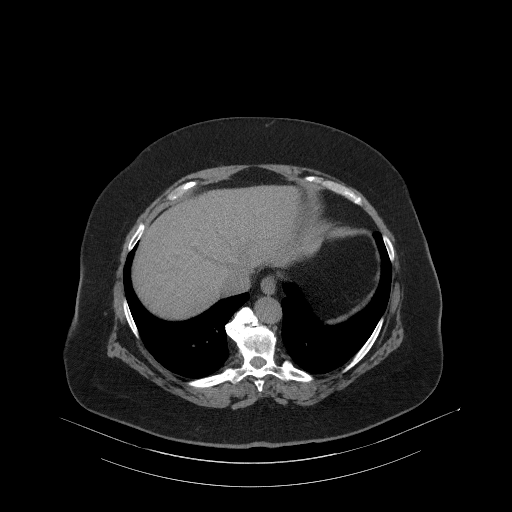
[im 160/172  soft-tissue]
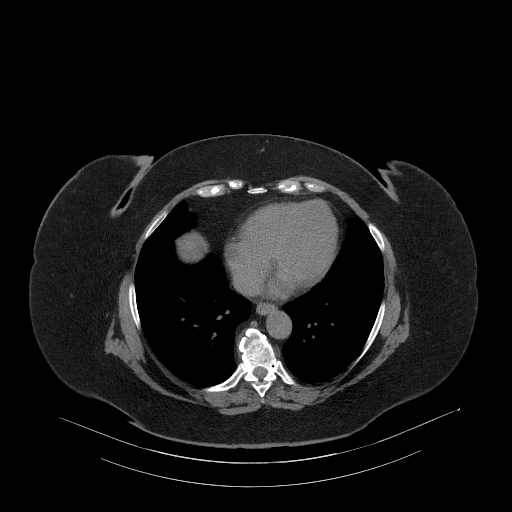

[Series 4: cor from thins · coronal · 1.08mm/px · 3 of 189 slices shown]
[im 63/189  soft-tissue]
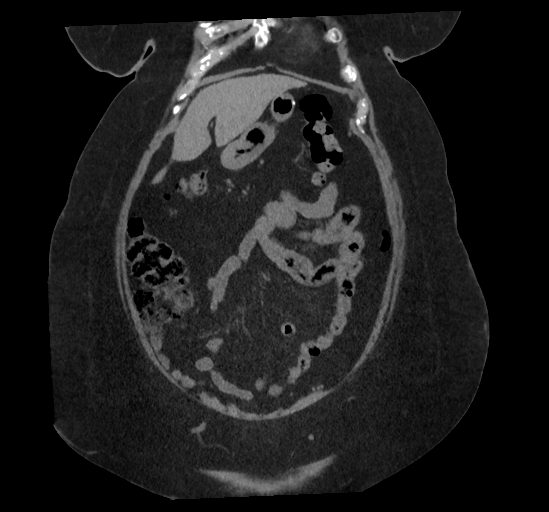
[im 84/189  soft-tissue]
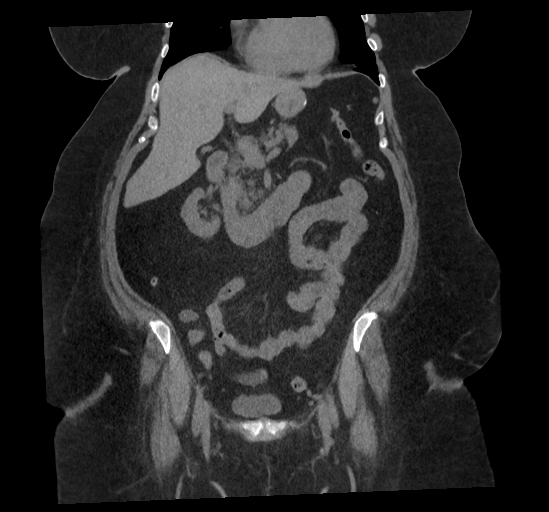
[im 105/189  soft-tissue]
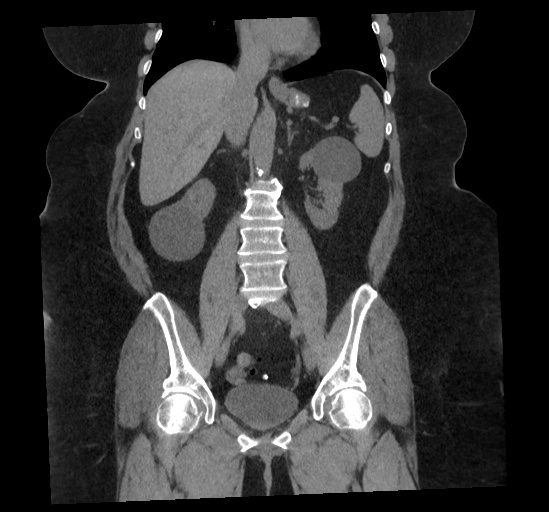

[16 of 46 positions shown; findings below may reference images not displayed]

FINDINGS: LUNG BASES: Prior granulomatous disease. No pleural effusions. 
HEPATOBILIARY: Non contrast images show no mass or biliary dilatation. No 
gallstones. 
SPLEEN: Normal in size. 
PANCREAS: No evidence for ductal dilatation or mass.   
ADRENALS: No mass. 
GENITOURINARY: Status post left posterior mid renal cryoablation with 2.1 x
cm peripherally calcified soft tissue nodule with surrounding thin curvilinear 
density measuring 3.1 x 2.7 cm in overall dimensions (previously 3.7 cm). 
Bilateral renal cysts. No hydronephrosis or kidney stones. Bladder is 
unremarkable. Hysterectomy. 
LYMPH NODES: No adenopathy. 
STOMACH, SMALL BOWEL AND COLON: Postsurgical changes status post gastric bypass. 
No bowel wall thickening or obstruction. Normal appendix. 
VASCULAR STRUCTURES: No aneurysm. Atherosclerosis. 
MUSCULOSKELETAL: Small fat-containing umbilical hernia. No acute osseous 
abnormality. Scattered degenerative changes.
IMPRESSION: 1.  Status post left posterior mid renal cryoablation with improvement in the 
postsurgical change/nodule: Lack of intravenous contrast limits evaluation for 
recurrent tumor.  
2.  Bilateral renal cysts.  
3.  Gastric bypass and hysterectomy. 
4.  Small fat-containing umbilical hernia.  
5.  Atherosclerosis.  
6.  Degenerative change. 
RADIATION DOSE REDUCTION: All CT scans are performed using radiation dose 
reduction techniques, when applicable.  Technical factors are evaluated and 
adjusted to ensure appropriate moderation of exposure.  Automated dose 
management technology is applied to adjust the radiation doses to minimize 
exposure while achieving diagnostic quality images.

## 2023-06-02 IMAGING — MR MRI RIGHT KNEE WITHOUT CONTRAST
4 of 5 series · 19 of 40 positions shown · IV contrast (gadolinium)
Comparison: None

________________________________________________________________________________________________ 
MRI RIGHT KNEE WITHOUT CONTRAST, 06/02/2023 [DATE]: 
CLINICAL INDICATION: Pain In Right Knee
TECHNIQUE: Multiplanar, multiecho position MR images of the knee were performed 
without intravenous gadolinium enhancement. Patient was scanned on a 3T magnet.

[Series 201: survey_right · axial · 10.0mm · 0.94mm/px · z∈[-40,+150]mm · 3 of 15 slices shown]
[im 1/15]
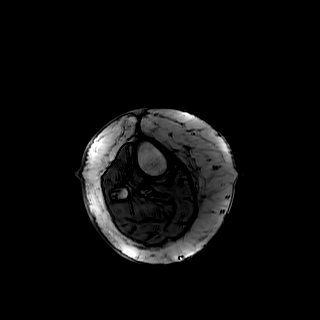
[im 8/15]
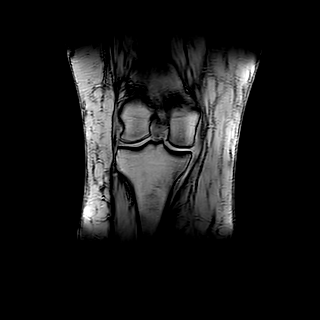
[im 15/15]
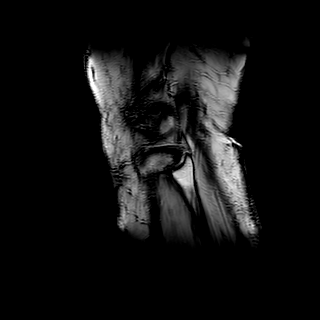

[Series 301: pdw spair ax · axial · 3.0mm · 0.29mm/px · z∈[-37,+71]mm · 5 of 36 slices shown]
[im 1/36]
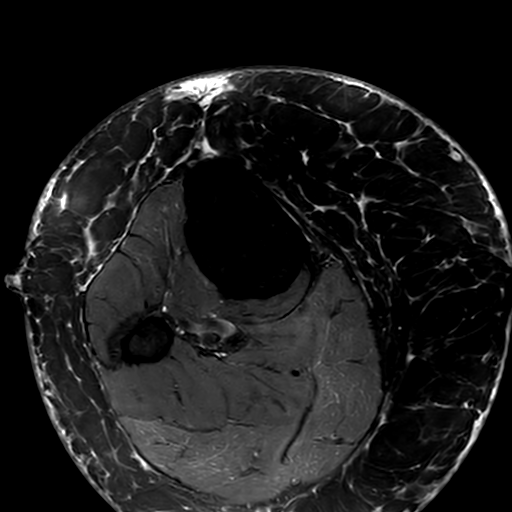
[im 5/36]
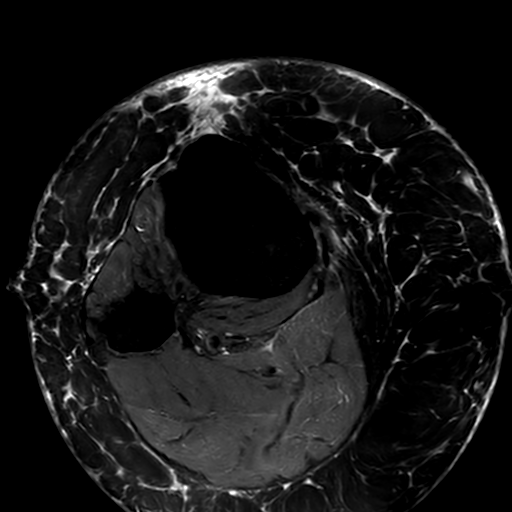
[im 9/36]
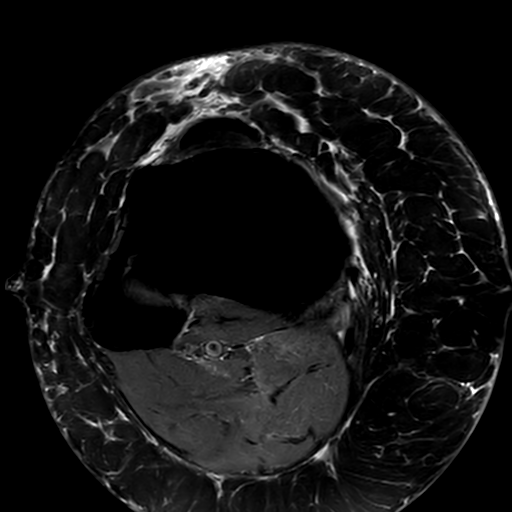
[im 18/36]
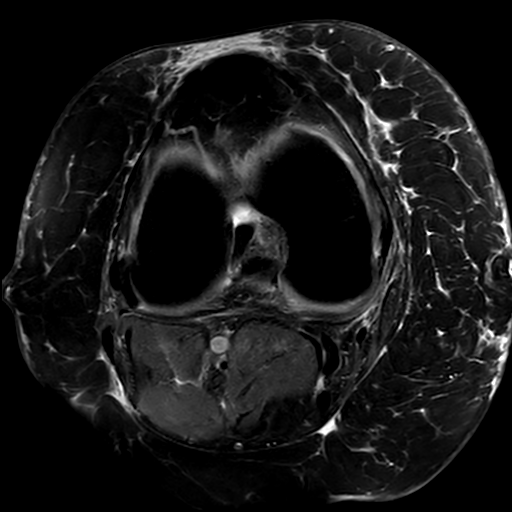
[im 31/36]
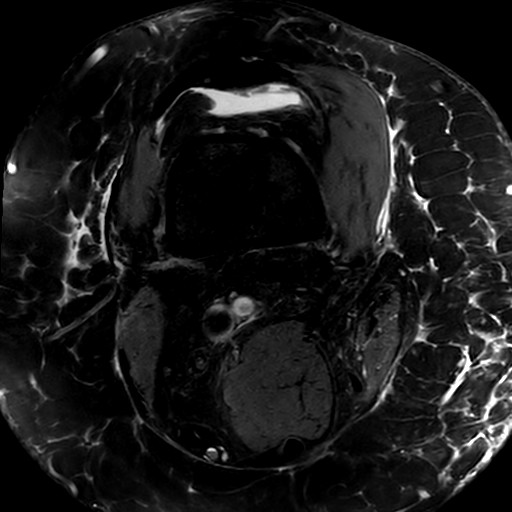

[Series 401: pdw/spair_sag-new · sagittal · 3.0mm · 0.31mm/px · 3 of 32 slices shown]
[im 5/32]
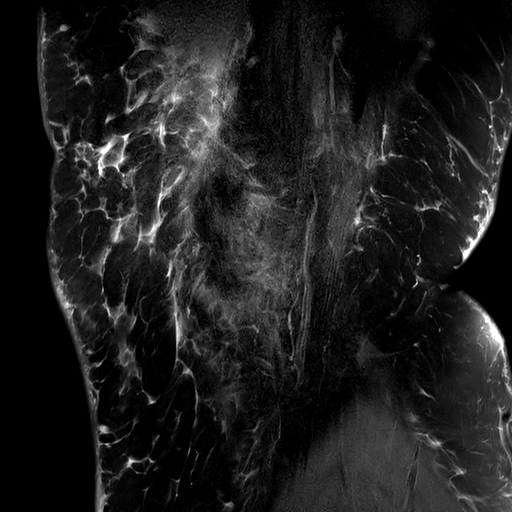
[im 18/32]
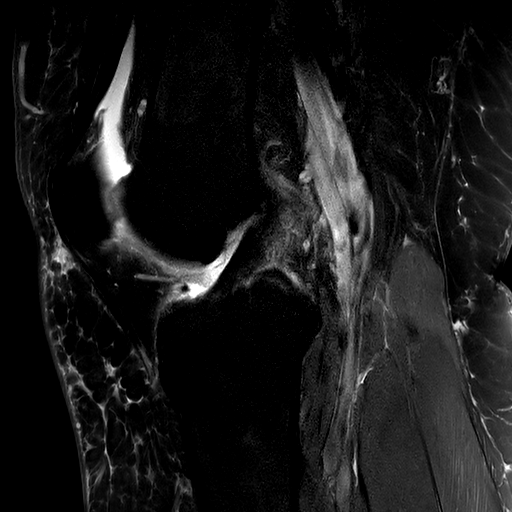
[im 27/32]
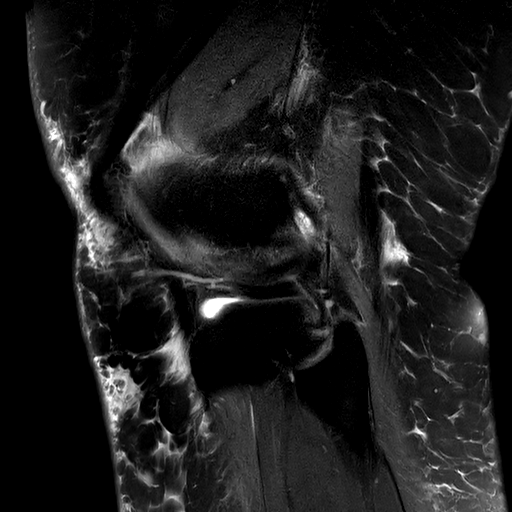

[Series 601: T1 · coronal · 3.0mm · 0.29mm/px · 8 of 40 slices shown]
[im 1/40]
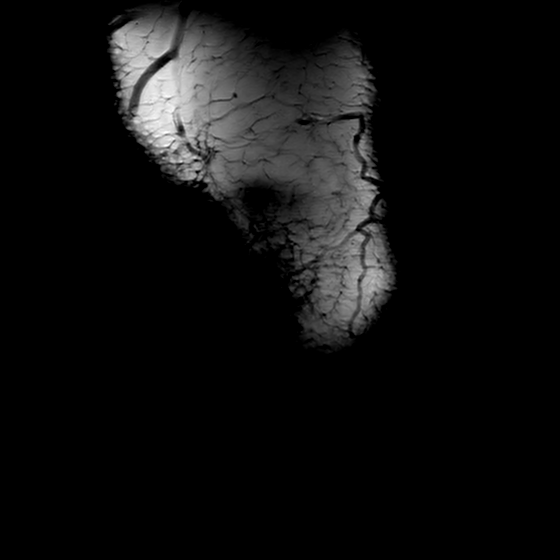
[im 5/40]
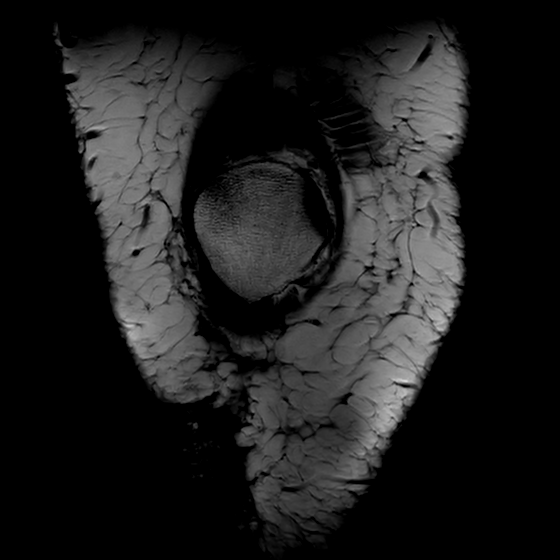
[im 14/40]
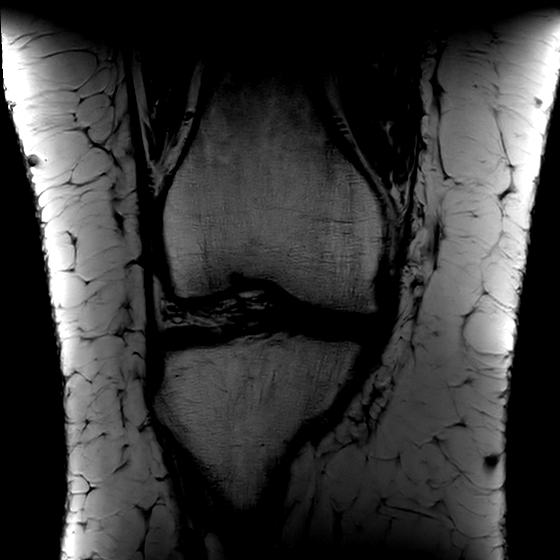
[im 18/40]
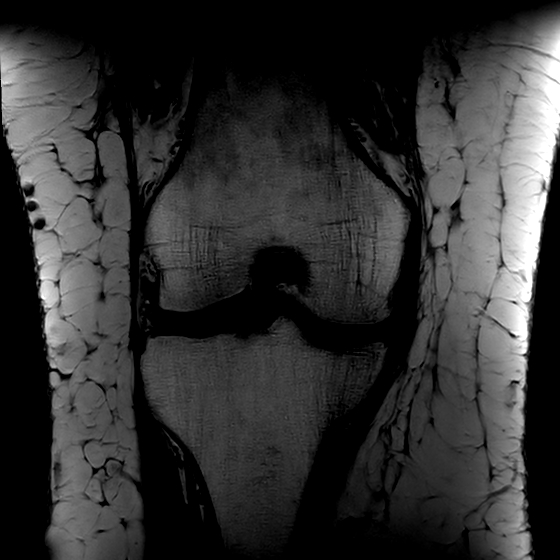
[im 22/40]
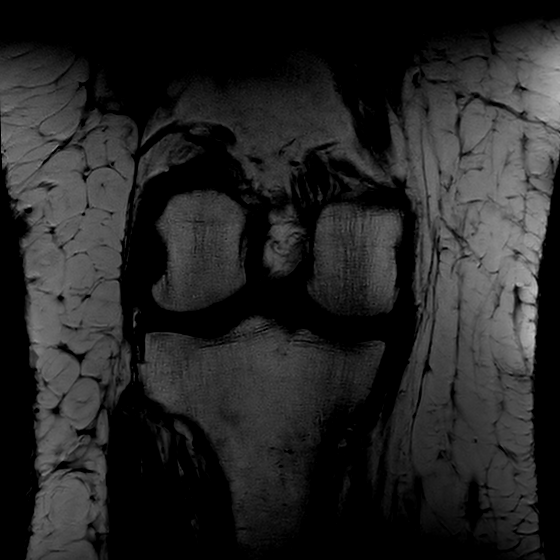
[im 27/40]
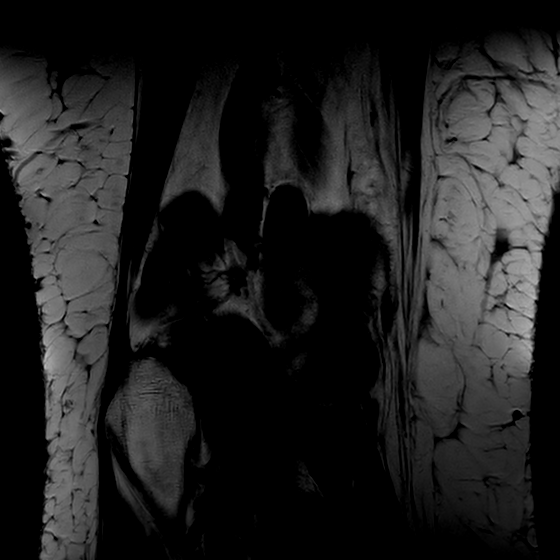
[im 35/40]
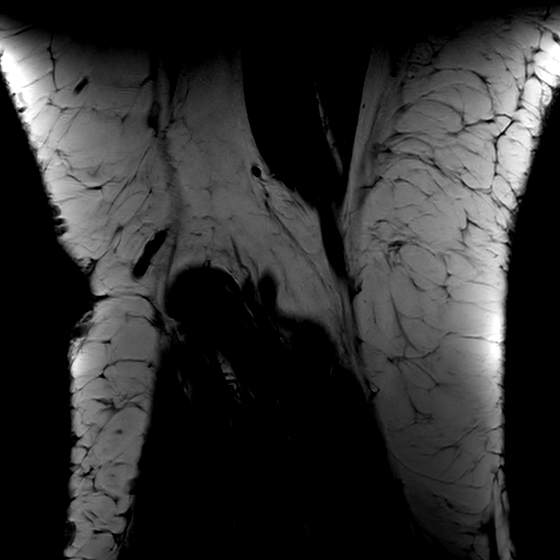
[im 40/40]
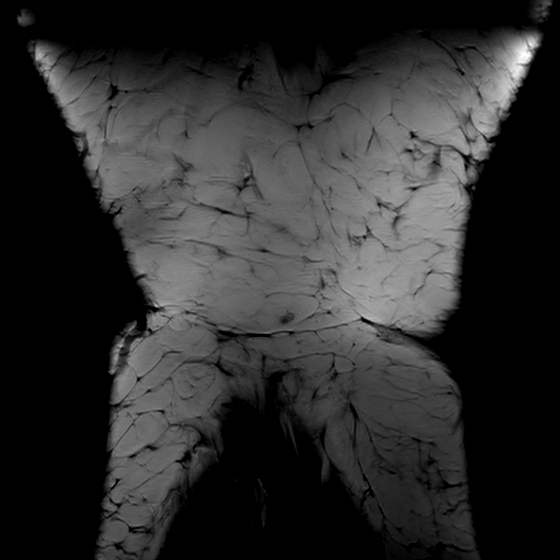

[19 of 40 positions shown; findings below may reference images not displayed]

FINDINGS: MEDIAL COMPARTMENT: Horizontal tear of the posterior horn of the medial meniscus 
and vertical tear of the body. 0.3 cm medial meniscal extrusion. Up to grade III 
chondromalacia and cartilage fissuring of the weightbearing surface of the 
medial femoral condyle 
LATERAL COMPARTMENT: The lateral meniscus is intact without tear or extrusion. 
Articular cartilage is preserved. 
PATELLOFEMORAL COMPARTMENT: The patella is centrally located. Up to grade IV 
chondromalacia and subcortical cystic change of the trochlea and grade II 
chondromalacia of the patella. 
TIBIOFIBULAR COMPARTMENT: Negative. 
LIGAMENTS: The anterior cruciate ligament is intact. The posterior cruciate 
ligament is intact. The medial collateral ligament and lateral collateral 
ligaments are preserved. 
EXTENSOR MECHANISM: The quadriceps and patellar tendon are preserved. The medial 
and lateral retinacula are intact. 
POSTEROMEDIAL CORNER: The semimembranosus and pes anserine tendons are 
preserved. The posterior oblique ligament and posterior medial joint capsule are 
intact. 
POSTEROLATERAL CORNER: The popliteal tendon and popliteofibular ligament are 
intact. The biceps femoris is negative. 
BONES: Normal bone marrow signal intensity. No fracture or contusion or stress 
response.  
ADDITIONAL FINDINGS: Small joint effusion. No popliteal cyst. Minimal 
gastrocnemius muscle edema. Neurovascular bundles are negative. Deep and 
subcutaneous soft tissue swelling.
IMPRESSION: 1.  Horizontal tear of the posterior horn of the medial meniscus, vertical tear 
of the body and medial meniscal extrusion.  
2.  Moderate medial compartment/patellofemoral joint degenerative change. 
3.  Small joint effusion, minimal gastrocnemius muscle edema and soft tissue 
swelling.
